# Patient Record
Sex: Female | Born: 1995 | Race: Black or African American | Hispanic: Yes | Marital: Single | Smoking: Never smoker
Health system: Southern US, Community
[De-identification: ages and names within clinical notes are randomized; demographics above are authoritative.]

## PROBLEM LIST (undated history)

## (undated) DIAGNOSIS — J45909 Unspecified asthma, uncomplicated: Secondary | ICD-10-CM

---

## 2019-09-30 ENCOUNTER — Encounter (HOSPITAL_COMMUNITY): Payer: Self-pay

## 2019-09-30 ENCOUNTER — Ambulatory Visit (HOSPITAL_COMMUNITY)
Admission: RE | Admit: 2019-09-30 | Discharge: 2019-09-30 | Disposition: A | Discharge status: Home / Self Care | Payer: BC Managed Care – PPO | Source: Ambulatory Visit | Attending: Family Medicine | Admitting: Family Medicine

## 2019-09-30 ENCOUNTER — Other Ambulatory Visit: Payer: Self-pay

## 2019-09-30 ENCOUNTER — Ambulatory Visit (HOSPITAL_COMMUNITY)
Admission: EM | Admit: 2019-09-30 | Discharge: 2019-09-30 | Disposition: A | Discharge status: Home / Self Care | Payer: BC Managed Care – PPO | Attending: Family Medicine | Admitting: Family Medicine

## 2019-09-30 DIAGNOSIS — S6010XA Contusion of unspecified finger with damage to nail, initial encounter: Secondary | ICD-10-CM | POA: Diagnosis not present

## 2019-09-30 DIAGNOSIS — S6702XA Crushing injury of left thumb, initial encounter: Secondary | ICD-10-CM | POA: Insufficient documentation

## 2019-09-30 DIAGNOSIS — Y9289 Other specified places as the place of occurrence of the external cause: Secondary | ICD-10-CM | POA: Diagnosis not present

## 2019-09-30 HISTORY — DX: Unspecified asthma, uncomplicated: J45.909

## 2019-09-30 NOTE — Discharge Instructions (Signed)
You may use over the counter ibuprofen or acetaminophen as needed.  ° °

## 2019-09-30 NOTE — ED Triage Notes (Signed)
Pt states she closed her thumb in the care door. Pt states this happened around 6:30 pm today. ( left thumb)

## 2019-10-01 NOTE — ED Provider Notes (Signed)
[facility] CARE CENTER   [id number] 09/30/19 Arrival Time: 1902  ASSESSMENT & PLAN:  1. Crush injury to thumb, left, initial encounter   2. Subungual hematoma of digit of hand, initial encounter     I have personally viewed the imaging studies ordered. No fracture observed.  With x-ray unavailable here, patient sent to outpatient radiology for imaging. Discussed specialist evaluation should a fracture be present; symptomatic management if not. Ibuprofen for discomfort. Simple wound care instructions discussed.  Procedure: Drainage of Subungual Hematoma LEFT thumb cleaned with chlorhexidine. Declines digital block. R/B/I/A discussed. Complications may include loss of the nail, re-accumulation of hematoma, and infection. Verbal consent to proceed obtained. Single nail trephination successfully performed via cautery pen with drainage of dark red blood. Patient reports relief of pressure. Site bandaged. To keep digit clean and dry for 48 hours.  Work note provided.   Reviewed expectations re: course of current medical issues. Questions answered. Outlined signs and symptoms indicating need for more acute intervention. Patient verbalized understanding. After Visit Summary given.  SUBJECTIVE: History from: patient. Faith Butler is a 23 y.o. female who reports persistent moderate pain of her left distal thumb; described as aching/throbbing; without radiation. Onset: abrupt. First noted: 1-2 hours ago. Injury/trama: reports shutting thumb in car door; immediate pain. Symptoms have progressed to a point and plateaued since beginning. Aggravating factors: certain movements. Alleviating factors: have not been identified. Associated symptoms: none reported. Extremity sensation changes or weakness: none. Self treatment: has not tried OTC therapies.  History of similar: no.  History reviewed. No pertinent surgical history.   ROS: As per HPI. All other systems negative.     OBJECTIVE:  Vitals:   09/30/19 1925 09/30/19 1928  BP:  (!) 143/79  Pulse:  86  Resp:  16  Temp:  98.6 F (37 C)  TempSrc:  Oral  SpO2:  99%  Weight: 79.4 kg     General appearance: alert; no distress HEENT: [place]; AT Neck: supple with FROM Resp: unlabored respirations Extremities: . L thumb: warm with well perfused appearance; well localized moderate tenderness over left distal thumb; without gross deformities; swelling: minimal; bruising: none; subungual hematoma present under proximal 1/3 of nail; normal thumb extension and flexion at DIP CV: brisk extremity capillary refill of LUE; 2+ radial pulse of LUE. Skin: warm and dry; no visible rashes Neurologic: gait normal; normal reflexes of LUE; normal sensation of LUE; normal strength of LUE Psychological: alert and cooperative; normal mood and affect  Imaging: DG Finger Thumb Left  Result Date: 09/30/2019 CLINICAL DATA:  Left thumb crush injury in a car door today. Initial encounter. EXAM: LEFT THUMB 2+V COMPARISON:  None. FINDINGS: There is no evidence of fracture or dislocation. There is no evidence of arthropathy or other focal bone abnormality. Soft tissues are unremarkable. IMPRESSION: Negative exam. Electronically Signed   By: Drusilla Kanner M.D.   On: 09/30/2019 20:29      No Known Allergies  Past Medical History:  Diagnosis Date  . Asthma    Social History   Socioeconomic History  . Marital status: Single    Spouse name: Not on file  . Number of children: Not on file  . Years of education: Not on file  . Highest education level: Not on file  Occupational History  . Not on file  Tobacco Use  . Smoking status: Never Smoker  . Smokeless tobacco: Never Used  Substance and Sexual Activity  . Alcohol use: Never  . Drug use: Never  .  Sexual activity: Yes  Other Topics Concern  . Not on file  Social History Narrative  . Not on file   Social Determinants of Health   Financial Resource Strain:   .  Difficulty of Paying Living Expenses: Not on file  Food Insecurity:   . Worried About Charity fundraiser in the Last Year: Not on file  . Ran Out of Food in the Last Year: Not on file  Transportation Needs:   . Lack of Transportation (Medical): Not on file  . Lack of Transportation (Non-Medical): Not on file  Physical Activity:   . Days of Exercise per Week: Not on file  . Minutes of Exercise per Session: Not on file  Stress:   . Feeling of Stress : Not on file  Social Connections:   . Frequency of Communication with Friends and Family: Not on file  . Frequency of Social Gatherings with Friends and Family: Not on file  . Attends Religious Services: Not on file  . Active Member of Clubs or Organizations: Not on file  . Attends Archivist Meetings: Not on file  . Marital Status: Not on file   Family History  Problem Relation Age of Onset  . Healthy Mother   . Healthy Father    History reviewed. No pertinent surgical history.    Vanessa Kick, MD 10/01/19 [phone]

## 2020-12-25 ENCOUNTER — Ambulatory Visit
Admission: EM | Admit: 2020-12-25 | Discharge: 2020-12-25 | Disposition: A | Discharge status: Home / Self Care | Payer: BC Managed Care – PPO | Attending: Family Medicine | Admitting: Family Medicine

## 2020-12-25 ENCOUNTER — Other Ambulatory Visit: Payer: Self-pay

## 2020-12-25 DIAGNOSIS — J4541 Moderate persistent asthma with (acute) exacerbation: Secondary | ICD-10-CM

## 2020-12-25 MED ORDER — ALBUTEROL SULFATE HFA 108 (90 BASE) MCG/ACT IN AERS: 1.0000 | INHALATION_SPRAY | Freq: Four times a day (QID) | RESPIRATORY_TRACT | 1 refills | Status: AC | PRN

## 2020-12-25 MED ORDER — PREDNISONE 20 MG PO TABS: 40.0000 mg | ORAL_TABLET | Freq: Every day | ORAL | 0 refills | Status: DC

## 2020-12-25 NOTE — ED Triage Notes (Signed)
Pt presents with c/o cough since Tuesday with some sob, per pt has h/o asthma

## 2020-12-27 NOTE — ED Provider Notes (Signed)
[facility] CARE CENTER   [id number] 12/25/20 Arrival Time: 1555  ASSESSMENT & PLAN:  1. Moderate persistent asthma with acute exacerbation    No indication for chest imaging at this time.  Begin: Meds ordered this encounter  Medications  . predniSONE (DELTASONE) 20 MG tablet    Sig: Take 2 tablets (40 mg total) by mouth daily.    Dispense:  10 tablet    Refill:  0  . albuterol (VENTOLIN HFA) 108 (90 Base) MCG/ACT inhaler    Sig: Inhale 1-2 puffs into the lungs every 6 (six) hours as needed for wheezing or shortness of breath.    Dispense:  1 each    Refill:  1    Asthma precautions given. OTC symptom care as needed.  Recommend:  Follow-up Information    [facility] .   Specialty: Urgent Care Why: If worsening or failing to improve as anticipated. Contact information: [address] [id number] mc [place] [address] [phone]              Reviewed expectations re: course of current medical issues. Questions answered. Outlined signs and symptoms indicating need for more acute intervention. Patient verbalized understanding. After Visit Summary given.  SUBJECTIVE: History from: patient.  Faith Butler is a 25 y.o. female who presents with complaint of fairly persistent wheezing and dry cough. Onset gradual, sev d ago. Triggers: change in weather. Describes wheezing as moderate when present. Fever: no. Overall normal PO intake without n/v. Sick contacts: no. Ambulatory without difficulty. No LE edema. Typically her asthma is well controlled. Inhaler use: none; out of inhaler; does not need regularly. OTC treatment: none.   Social History   Tobacco Use  Smoking Status Never Smoker  Smokeless Tobacco Never Used      OBJECTIVE:  Vitals:   12/25/20 1602  BP: 127/84  Pulse: (!) 109  Resp: 20  Temp: 98.4 F (36.9 C)  SpO2: 99%    Slight tachycardia noted.  General appearance: alert;  NAD HEENT: [place]; AT; without nasal congestion Neck: supple without LAD Cv: RRR without murmer Lungs: unlabored respirations, mild to moderate bilateral expiratory wheezing; cough: occasional dry; no significant respiratory distress Skin: warm and dry Psychological: alert and cooperative; normal mood and affect   No Known Allergies  Past Medical History:  Diagnosis Date  . Asthma    Family History  Problem Relation Age of Onset  . Healthy Mother   . Healthy Father    Social History   Socioeconomic History  . Marital status: Single    Spouse name: Not on file  . Number of children: Not on file  . Years of education: Not on file  . Highest education level: Not on file  Occupational History  . Not on file  Tobacco Use  . Smoking status: Never Smoker  . Smokeless tobacco: Never Used  Substance and Sexual Activity  . Alcohol use: Never  . Drug use: Never  . Sexual activity: Yes  Other Topics Concern  . Not on file  Social History Narrative  . Not on file   Social Determinants of Health   Financial Resource Strain: Not on file  Food Insecurity: Not on file  Transportation Needs: Not on file  Physical Activity: Not on file  Stress: Not on file  Social Connections: Not on file  Intimate Partner Violence: Not on file            Mardella Layman, MD 12/27/20 [phone]

## 2021-08-27 ENCOUNTER — Other Ambulatory Visit: Payer: Self-pay

## 2021-08-27 ENCOUNTER — Inpatient Hospital Stay (HOSPITAL_COMMUNITY): Payer: Medicaid Other

## 2021-08-27 ENCOUNTER — Inpatient Hospital Stay (HOSPITAL_COMMUNITY)
Admission: AD | Admit: 2021-08-27 | Discharge: 2021-08-27 | Disposition: A | Discharge status: Home / Self Care | Payer: Medicaid Other | Attending: Obstetrics and Gynecology | Admitting: Obstetrics and Gynecology

## 2021-08-27 ENCOUNTER — Encounter (HOSPITAL_COMMUNITY): Payer: Self-pay | Admitting: Obstetrics and Gynecology

## 2021-08-27 DIAGNOSIS — O4690 Antepartum hemorrhage, unspecified, unspecified trimester: Secondary | ICD-10-CM | POA: Diagnosis not present

## 2021-08-27 DIAGNOSIS — O3680X0 Pregnancy with inconclusive fetal viability, not applicable or unspecified: Secondary | ICD-10-CM | POA: Diagnosis not present

## 2021-08-27 DIAGNOSIS — Z3A01 Less than 8 weeks gestation of pregnancy: Secondary | ICD-10-CM | POA: Diagnosis not present

## 2021-08-27 DIAGNOSIS — O209 Hemorrhage in early pregnancy, unspecified: Secondary | ICD-10-CM | POA: Insufficient documentation

## 2021-08-27 LAB — COMPREHENSIVE METABOLIC PANEL
ALT: 15 U/L (ref 0–44)
AST: 18 U/L (ref 15–41)
Albumin: 3.6 g/dL (ref 3.5–5.0)
Alkaline Phosphatase: 46 U/L (ref 38–126)
Anion gap: 5 (ref 5–15)
BUN: <5 mg/dL — ABNORMAL LOW (ref 6–20)
CO2: 25 mmol/L (ref 22–32)
Calcium: 9.7 mg/dL (ref 8.9–10.3)
Chloride: 104 mmol/L (ref 98–111)
Creatinine, Ser: 0.9 mg/dL (ref 0.44–1.00)
GFR, Estimated: >60 mL/min (ref >60)
Glucose, Bld: 102 mg/dL — ABNORMAL HIGH (ref 70–99)
Potassium: 4 mmol/L (ref 3.5–5.1)
Sodium: 134 mmol/L — ABNORMAL LOW (ref 135–145)
Total Bilirubin: 0.6 mg/dL (ref 0.3–1.2)
Total Protein: 8 g/dL (ref 6.5–8.1)

## 2021-08-27 LAB — URINALYSIS, ROUTINE W REFLEX MICROSCOPIC
Bilirubin Urine: NEGATIVE
Glucose, UA: NEGATIVE mg/dL
Ketones, ur: 5 mg/dL — AB
Nitrite: NEGATIVE
Protein, ur: NEGATIVE mg/dL
Specific Gravity, Urine: 1.019 (ref 1.005–1.030)
pH: 5 (ref 5.0–8.0)

## 2021-08-27 LAB — CBC WITH DIFFERENTIAL/PLATELET
Abs Immature Granulocytes: 0.02 10*3/uL (ref 0.00–0.07)
Basophils Absolute: 0 10*3/uL (ref 0.0–0.1)
Basophils Relative: 0 %
Eosinophils Absolute: 0 10*3/uL (ref 0.0–0.5)
Eosinophils Relative: 0 %
HCT: 40.7 % (ref 36.0–46.0)
Hemoglobin: 13.3 g/dL (ref 12.0–15.0)
Immature Granulocytes: 0 %
Lymphocytes Relative: 29 %
Lymphs Abs: 2.5 10*3/uL (ref 0.7–4.0)
MCH: 27.9 pg (ref 26.0–34.0)
MCHC: 32.7 g/dL (ref 30.0–36.0)
MCV: 85.3 fL (ref 80.0–100.0)
Monocytes Absolute: 0.6 10*3/uL (ref 0.1–1.0)
Monocytes Relative: 7 %
Neutro Abs: 5.6 10*3/uL (ref 1.7–7.7)
Neutrophils Relative %: 64 %
Platelets: 454 10*3/uL — ABNORMAL HIGH (ref 150–400)
RBC: 4.77 MIL/uL (ref 3.87–5.11)
RDW: 14.6 % (ref 11.5–15.5)
WBC: 8.8 10*3/uL (ref 4.0–10.5)
nRBC: 0 % (ref 0.0–0.2)

## 2021-08-27 LAB — ABO/RH: ABO/RH(D): B POS

## 2021-08-27 LAB — POCT PREGNANCY, URINE: Preg Test, Ur: POSITIVE — AB

## 2021-08-27 LAB — HCG, QUANTITATIVE, PREGNANCY: hCG, Beta Chain, Quant, S: 18334 m[IU]/mL — ABNORMAL HIGH (ref <5)

## 2021-08-27 NOTE — MAU Note (Signed)
Pollie Corp is a 25 y.o. here in MAU reporting: had + UPT last week. Started spotting on Wednesday, last night bleeding started getting a little heavier. Not wearing a pad. No pain.  LMP: 07/07/21  Onset of complaint: ongoing  Pain score: 0/10  Vitals:   08/27/21 1547  BP: 119/67  Pulse: 99  Resp: 16  Temp: 98.3 F (36.8 C)  SpO2: 99%     Lab orders placed from triage: upt

## 2021-08-27 NOTE — MAU Provider Note (Addendum)
History   Faith Butler is a very nice 1/0 at [redacted]w[redacted]d by LMP with vaginal bleeding.   CSN: [id number]  Arrival date and time: 08/27/21 1509   Event Date/Time   First Provider Initiated Contact with Patient 08/27/21 1558      Chief Complaint  Patient presents with   Vaginal Bleeding   Vaginal Bleeding The patient's pertinent negatives include no pelvic pain or vaginal discharge. Pertinent negatives include no abdominal pain, chills, constipation, diarrhea, fever, nausea or vomiting. She started with spotting about one week ago and today she had heavy bleeding. She has had minimal cramping but notes she didn't feel like she was pregnant. She did not have any clots and did not feel like she passed any tissue.   OB History     Gravida  1   Para      Term      Preterm      AB      Living         SAB      IAB      Ectopic      Multiple      Live Births              Past Medical History:  Diagnosis Date   Asthma     No past surgical history on file.  Family History  Problem Relation Age of Onset   Healthy Mother    Healthy Father     Social History   Tobacco Use   Smoking status: Never   Smokeless tobacco: Never  Substance Use Topics   Alcohol use: Never   Drug use: Never    Allergies: No Known Allergies  Medications Prior to Admission  Medication Sig Dispense Refill Last Dose   albuterol (VENTOLIN HFA) 108 (90 Base) MCG/ACT inhaler Inhale 1-2 puffs into the lungs every 6 (six) hours as needed for wheezing or shortness of breath. 1 each 1    predniSONE (DELTASONE) 20 MG tablet Take 2 tablets (40 mg total) by mouth daily. 10 tablet 0     Review of Systems  Constitutional:  Negative for chills and fever.  Respiratory:  Negative for shortness of breath.   Cardiovascular:  Negative for chest pain.  Gastrointestinal:  Negative for abdominal pain, constipation, diarrhea, nausea and vomiting.  Genitourinary:  Positive for vaginal bleeding. Negative  for pelvic pain and vaginal discharge.  Physical Exam   Blood pressure 119/67, pulse 99, temperature 98.3 F (36.8 C), temperature source Oral, resp. rate 16, height 4\' 11"  (1.499 m), weight 85.6 kg, last menstrual period 07/07/2021, SpO2 99 %.  Physical Exam Constitutional:      Appearance: Normal appearance. She is normal weight.  HENT:     Head: Normocephalic and atraumatic.     Nose: Nose normal.  Eyes:     Extraocular Movements: Extraocular movements intact.     Conjunctiva/sclera: Conjunctivae normal.     Pupils: Pupils are equal, round, and reactive to light.  Cardiovascular:     Rate and Rhythm: Normal rate.  Pulmonary:     Effort: Pulmonary effort is normal.  Musculoskeletal:     Cervical back: Normal range of motion.  Skin:    General: Skin is warm and dry.  Neurological:     General: No focal deficit present.     Mental Status: She is alert and oriented to person, place, and time.  Psychiatric:        Mood and Affect: Mood normal.  Behavior: Behavior normal.        Thought Content: Thought content normal.        Judgment: Judgment normal.    MAU Course  Procedures  MDM HCG - 18334 CBC    Component Value Date/Time   WBC 8.8 08/27/2021 1606   RBC 4.77 08/27/2021 1606   HGB 13.3 08/27/2021 1606   HCT 40.7 08/27/2021 1606   PLT 454 (H) 08/27/2021 1606   MCV 85.3 08/27/2021 1606   MCH 27.9 08/27/2021 1606   MCHC 32.7 08/27/2021 1606   RDW 14.6 08/27/2021 1606   LYMPHSABS 2.5 08/27/2021 1606   MONOABS 0.6 08/27/2021 1606   EOSABS 0.0 08/27/2021 1606   BASOSABS 0.0 08/27/2021 1606   CMP Latest Ref Rng & Units 08/27/2021  Glucose 70 - 99 mg/dL 562(Z)  BUN 6 - 20 mg/dL <3(Y)  Creatinine 8.65 - 1.00 mg/dL 7.84  Sodium 696 - 295 mmol/L 134(L)  Potassium 3.5 - 5.1 mmol/L 4.0  Chloride 98 - 111 mmol/L 104  CO2 22 - 32 mmol/L 25  Calcium 8.9 - 10.3 mg/dL 9.7  Total Protein 6.5 - 8.1 g/dL 8.0  Total Bilirubin 0.3 - 1.2 mg/dL 0.6  Alkaline Phos 38 -  126 U/L 46  AST 15 - 41 U/L 18  ALT 0 - 44 U/L 15   Rh positive   US OB LESS THAN 14 WEEKS WITH OB TRANSVAGINAL  Result Date: 08/27/2021 CLINICAL DATA:  Pregnant patient with vaginal bleeding. Gestational age by LMP 7 weeks 2 days. Beta hCG is pending at this time. EXAM: OBSTETRIC <14 WK [place] AND TRANSVAGINAL OB US TECHNIQUE: Both transabdominal and transvaginal ultrasound examinations were performed for complete evaluation of the gestation as well as the maternal uterus, adnexal regions, and pelvic cul-de-sac. Transvaginal technique was performed to assess early pregnancy. COMPARISON:  None. FINDINGS: Intrauterine gestational sac: None Yolk sac:  Not Visualized. Embryo:  Not Visualized. Cardiac Activity: Not Visualized. Subchorionic hemorrhage:  Not applicable. Maternal uterus/adnexae: The uterus is anteverted. The endometrium is thickened at 14.6 mm. No intrauterine gestational sac or fluid in the endometrial canal. The left ovary is normal measuring 2.9 x 2.2 x 2.3 cm. Ovarian blood flow is seen. The right ovary measures 2.4 x 1.9 x 2.6 cm and contains a 1.8 cm cyst. Ovarian blood flow is seen. There is no adnexal mass. No pelvic free fluid. IMPRESSION: No intrauterine pregnancy or findings suspicious for ectopic pregnancy. Findings are consistent with pregnancy of unknown location and may reflect early intrauterine pregnancy not yet visualized sonographically, occult ectopic pregnancy, or failed pregnancy. Recommend trending of beta HCG as well as follow-up ultrasound in 7-10 days based on clinical course. Electronically Signed   By: Narda Rutherford M.D.   On: 08/27/2021 17:25        Assessment and Plan  Pregnancy of unknown anatomic location at [redacted]w[redacted]d by LMP - We discussed pregnancy of unknown location but that most likely consistent with a recent miscarriage based on her beta of 18K. I would expect IUP or EUP based on this beta but US shows no IUP/EUP. If SAB, expect beta to drop. Other  possibility is a molar pregnancy if beta rises. Unlikely to be viable pregnancy based on beta, GA by LMP and her bleeding.  - Scheduled for repeat beta on Tuesday. Discussed the importance of follow up. She lives in [place] area but comes to this area often. She would like to follow up with [place]. Appt made at [facility].  - Discussed  common causes of SAB. Reassured her this was nothing in her control.  - Discussed that she should wait for one normal cycle and then they may begin trying again if she feels emotionally ready as well - Rhogam not indicated.  - Answered all questions  Milas Hock 08/27/2021, 7:24 PM

## 2021-08-30 ENCOUNTER — Ambulatory Visit (INDEPENDENT_AMBULATORY_CARE_PROVIDER_SITE_OTHER): Payer: Medicaid Other

## 2021-08-30 ENCOUNTER — Ambulatory Visit: Payer: Self-pay

## 2021-08-30 ENCOUNTER — Other Ambulatory Visit: Payer: Self-pay

## 2021-08-30 DIAGNOSIS — O3680X0 Pregnancy with inconclusive fetal viability, not applicable or unspecified: Secondary | ICD-10-CM

## 2021-08-30 NOTE — Progress Notes (Signed)
Patient was assessed and managed by nursing staff during this encounter. I have reviewed the chart and agree with the documentation and plan. I have also made any necessary editorial changes.  Catalina Antigua, MD 08/30/2021 3:43 PM

## 2021-08-30 NOTE — Progress Notes (Signed)
Subject:  Patient presents for a follow up hcg quant. Last quant 25,003 on 11/12  Objective: Patient reports having some light spotting which is the same as her MAU visit. She denies having any pain at this time.   Assessment: Patient has no other concerns at this time.  Plan: Patient advised that she will be contacted with results and next steps. Advised to continue to monitor bleeding and pain and when to seek medical attention. Patient verbalized understanding.

## 2021-08-31 ENCOUNTER — Inpatient Hospital Stay (HOSPITAL_COMMUNITY): Payer: Medicaid Other | Admitting: Certified Registered"

## 2021-08-31 ENCOUNTER — Encounter (HOSPITAL_COMMUNITY): Payer: Self-pay | Admitting: Obstetrics & Gynecology

## 2021-08-31 ENCOUNTER — Telehealth: Payer: Self-pay | Admitting: Advanced Practice Midwife

## 2021-08-31 ENCOUNTER — Inpatient Hospital Stay (HOSPITAL_COMMUNITY): Payer: Medicaid Other

## 2021-08-31 ENCOUNTER — Encounter (HOSPITAL_COMMUNITY)
Admission: AD | Disposition: A | Discharge status: Home / Self Care | Payer: Self-pay | Source: Home / Self Care | Attending: Obstetrics & Gynecology

## 2021-08-31 ENCOUNTER — Inpatient Hospital Stay (HOSPITAL_COMMUNITY)
Admission: AD | Admit: 2021-08-31 | Discharge: 2021-08-31 | Disposition: A | Discharge status: Home / Self Care | Payer: Medicaid Other | Attending: Obstetrics & Gynecology | Admitting: Obstetrics & Gynecology

## 2021-08-31 DIAGNOSIS — O039 Complete or unspecified spontaneous abortion without complication: Secondary | ICD-10-CM | POA: Diagnosis present

## 2021-08-31 DIAGNOSIS — O99511 Diseases of the respiratory system complicating pregnancy, first trimester: Secondary | ICD-10-CM | POA: Insufficient documentation

## 2021-08-31 DIAGNOSIS — J45909 Unspecified asthma, uncomplicated: Secondary | ICD-10-CM | POA: Insufficient documentation

## 2021-08-31 DIAGNOSIS — O00101 Right tubal pregnancy without intrauterine pregnancy: Secondary | ICD-10-CM | POA: Diagnosis not present

## 2021-08-31 DIAGNOSIS — E669 Obesity, unspecified: Secondary | ICD-10-CM | POA: Insufficient documentation

## 2021-08-31 DIAGNOSIS — O99211 Obesity complicating pregnancy, first trimester: Secondary | ICD-10-CM | POA: Insufficient documentation

## 2021-08-31 DIAGNOSIS — O00111 Right tubal pregnancy with intrauterine pregnancy: Secondary | ICD-10-CM

## 2021-08-31 DIAGNOSIS — O3680X0 Pregnancy with inconclusive fetal viability, not applicable or unspecified: Secondary | ICD-10-CM

## 2021-08-31 HISTORY — PX: DIAGNOSTIC LAPAROSCOPY WITH REMOVAL OF ECTOPIC PREGNANCY: SHX6449

## 2021-08-31 LAB — BETA HCG QUANT (REF LAB): hCG Quant: 18475 m[IU]/mL

## 2021-08-31 LAB — TYPE AND SCREEN
ABO/RH(D): B POS
Antibody Screen: NEGATIVE

## 2021-08-31 SURGERY — LAPAROSCOPY, WITH ECTOPIC PREGNANCY SURGICAL TREATMENT
Anesthesia: General | Laterality: Right

## 2021-08-31 MED ORDER — DEXMEDETOMIDINE (PRECEDEX) IN NS 20 MCG/5ML (4 MCG/ML) IV SYRINGE
PREFILLED_SYRINGE | INTRAVENOUS | Status: AC
  Filled 2021-08-31: qty 10

## 2021-08-31 MED ORDER — ORAL CARE MOUTH RINSE: 15.0000 mL | Freq: Once | OROMUCOSAL | Status: AC

## 2021-08-31 MED ORDER — OXYCODONE HCL 5 MG PO TABS
ORAL_TABLET | ORAL | Status: AC
  Administered 2021-08-31: 5 mg via ORAL
  Filled 2021-08-31: qty 1

## 2021-08-31 MED ORDER — CHLORHEXIDINE GLUCONATE 0.12 % MT SOLN
15.0000 mL | Freq: Once | OROMUCOSAL | Status: AC
  Administered 2021-08-31: 15 mL via OROMUCOSAL
  Filled 2021-08-31: qty 15

## 2021-08-31 MED ORDER — FENTANYL CITRATE (PF) 250 MCG/5ML IJ SOLN
INTRAMUSCULAR | Status: AC
  Filled 2021-08-31: qty 5

## 2021-08-31 MED ORDER — OXYCODONE-ACETAMINOPHEN 5-325 MG PO TABS: 1.0000 | ORAL_TABLET | ORAL | 0 refills | Status: DC | PRN

## 2021-08-31 MED ORDER — 0.9 % SODIUM CHLORIDE (POUR BTL) OPTIME
TOPICAL | Status: DC | PRN
  Administered 2021-08-31: 1000 mL

## 2021-08-31 MED ORDER — OXYCODONE HCL 5 MG PO TABS: 5.0000 mg | ORAL_TABLET | Freq: Once | ORAL | Status: AC

## 2021-08-31 MED ORDER — BUPIVACAINE HCL (PF) 0.25 % IJ SOLN
INTRAMUSCULAR | Status: DC | PRN
  Administered 2021-08-31: 7 mL

## 2021-08-31 MED ORDER — DEXAMETHASONE SODIUM PHOSPHATE 10 MG/ML IJ SOLN
INTRAMUSCULAR | Status: DC | PRN
  Administered 2021-08-31: 10 mg via INTRAVENOUS

## 2021-08-31 MED ORDER — OXYCODONE-ACETAMINOPHEN 5-325 MG PO TABS: 1.0000 | ORAL_TABLET | ORAL | 0 refills | Status: AC | PRN

## 2021-08-31 MED ORDER — IBUPROFEN 600 MG PO TABS: 600.0000 mg | ORAL_TABLET | Freq: Four times a day (QID) | ORAL | 3 refills | Status: AC | PRN

## 2021-08-31 MED ORDER — SUCCINYLCHOLINE CHLORIDE 200 MG/10ML IV SOSY
PREFILLED_SYRINGE | INTRAVENOUS | Status: DC | PRN
Start: 2021-08-31 — End: 2021-08-31
  Administered 2021-08-31: 100 mg via INTRAVENOUS

## 2021-08-31 MED ORDER — LIDOCAINE 2% (20 MG/ML) 5 ML SYRINGE
INTRAMUSCULAR | Status: DC | PRN
  Administered 2021-08-31: 80 mg via INTRAVENOUS

## 2021-08-31 MED ORDER — SCOPOLAMINE 1 MG/3DAYS TD PT72
MEDICATED_PATCH | TRANSDERMAL | Status: AC
  Administered 2021-08-31: 1.5 mg via TRANSDERMAL
  Filled 2021-08-31: qty 1

## 2021-08-31 MED ORDER — FENTANYL CITRATE (PF) 100 MCG/2ML IJ SOLN: 25.0000 ug | INTRAMUSCULAR | Status: DC | PRN

## 2021-08-31 MED ORDER — LACTATED RINGERS IV SOLN: INTRAVENOUS | Status: DC

## 2021-08-31 MED ORDER — ONDANSETRON HCL 4 MG/2ML IJ SOLN
INTRAMUSCULAR | Status: DC | PRN
  Administered 2021-08-31: 4 mg via INTRAVENOUS

## 2021-08-31 MED ORDER — MIDAZOLAM HCL 2 MG/2ML IJ SOLN
INTRAMUSCULAR | Status: AC
  Filled 2021-08-31: qty 2

## 2021-08-31 MED ORDER — ACETAMINOPHEN 500 MG PO TABS
ORAL_TABLET | ORAL | Status: AC
  Administered 2021-08-31: 1000 mg via ORAL
  Filled 2021-08-31: qty 2

## 2021-08-31 MED ORDER — PHENYLEPHRINE 40 MCG/ML (10ML) SYRINGE FOR IV PUSH (FOR BLOOD PRESSURE SUPPORT)
PREFILLED_SYRINGE | INTRAVENOUS | Status: DC | PRN
  Administered 2021-08-31: 200 ug via INTRAVENOUS

## 2021-08-31 MED ORDER — PROPOFOL 10 MG/ML IV BOLUS
INTRAVENOUS | Status: DC | PRN
  Administered 2021-08-31: 170 mg via INTRAVENOUS

## 2021-08-31 MED ORDER — BUPIVACAINE HCL (PF) 0.25 % IJ SOLN
INTRAMUSCULAR | Status: AC
  Filled 2021-08-31: qty 30

## 2021-08-31 MED ORDER — ACETAMINOPHEN 500 MG PO TABS: 1000.0000 mg | ORAL_TABLET | Freq: Once | ORAL | Status: AC

## 2021-08-31 MED ORDER — SCOPOLAMINE 1 MG/3DAYS TD PT72: 1.0000 | MEDICATED_PATCH | Freq: Once | TRANSDERMAL | Status: DC

## 2021-08-31 MED ORDER — FENTANYL CITRATE (PF) 250 MCG/5ML IJ SOLN
INTRAMUSCULAR | Status: DC | PRN
  Administered 2021-08-31 (×3): 50 ug via INTRAVENOUS
  Administered 2021-08-31: 100 ug via INTRAVENOUS

## 2021-08-31 MED ORDER — PROMETHAZINE HCL 25 MG/ML IJ SOLN
6.2500 mg | INTRAMUSCULAR | Status: DC | PRN
Start: 2021-08-31 — End: 2021-08-31

## 2021-08-31 MED ORDER — PROPOFOL 10 MG/ML IV BOLUS
INTRAVENOUS | Status: AC
  Filled 2021-08-31: qty 20

## 2021-08-31 MED ORDER — ROCURONIUM BROMIDE 10 MG/ML (PF) SYRINGE
PREFILLED_SYRINGE | INTRAVENOUS | Status: DC | PRN
  Administered 2021-08-31: 50 mg via INTRAVENOUS

## 2021-08-31 MED ORDER — SODIUM CHLORIDE 0.9 % IR SOLN
Status: DC | PRN
  Administered 2021-08-31: 3000 mL

## 2021-08-31 MED ORDER — SUGAMMADEX SODIUM 200 MG/2ML IV SOLN
INTRAVENOUS | Status: DC | PRN
  Administered 2021-08-31: 200 mg via INTRAVENOUS

## 2021-08-31 SURGICAL SUPPLY — 31 items
CABLE HIGH FREQUENCY MONO STRZ (ELECTRODE) IMPLANT
DERMABOND ADVANCED (GAUZE/BANDAGES/DRESSINGS) ×1
DERMABOND ADVANCED .7 DNX12 (GAUZE/BANDAGES/DRESSINGS) ×1 IMPLANT
DURAPREP 26ML APPLICATOR (WOUND CARE) ×2 IMPLANT
GLOVE SURG ENC MOIS LTX SZ6.5 (GLOVE) ×2 IMPLANT
GLOVE SURG UNDER POLY LF SZ7 (GLOVE) ×8 IMPLANT
GOWN STRL REUS W/ TWL LRG LVL3 (GOWN DISPOSABLE) ×2 IMPLANT
GOWN STRL REUS W/TWL LRG LVL3 (GOWN DISPOSABLE) ×2
IRRIG SUCT STRYKERFLOW 2 WTIP (MISCELLANEOUS) ×2
IRRIGATION STRYKERFLOW (MISCELLANEOUS) IMPLANT
IRRIGATION SUCT STRKRFLW 2 WTP (MISCELLANEOUS) ×1 IMPLANT
IRRIGATOR STRYKERFLOW (MISCELLANEOUS)
KIT TURNOVER KIT B (KITS) ×2 IMPLANT
NEEDLE INSUFFLATION 14GA 120MM (NEEDLE) ×2 IMPLANT
NS IRRIG 1000ML POUR BTL (IV SOLUTION) ×2 IMPLANT
PACK LAPAROSCOPY BASIN (CUSTOM PROCEDURE TRAY) ×2 IMPLANT
PACK TRENDGUARD 450 HYBRID PRO (MISCELLANEOUS) ×1 IMPLANT
PROTECTOR NERVE ULNAR (MISCELLANEOUS) ×4 IMPLANT
SET TUBE SMOKE EVAC HIGH FLOW (TUBING) ×2 IMPLANT
SHEARS HARMONIC ACE PLUS 36CM (ENDOMECHANICALS) IMPLANT
SLEEVE ENDOPATH XCEL 5M (ENDOMECHANICALS) ×2 IMPLANT
STRIP CLOSURE SKIN 1/2X4 (GAUZE/BANDAGES/DRESSINGS) IMPLANT
SUT VICRYL 0 UR6 27IN ABS (SUTURE) ×4 IMPLANT
SUT VICRYL 4-0 PS2 18IN ABS (SUTURE) ×2 IMPLANT
TOWEL GREEN STERILE FF (TOWEL DISPOSABLE) ×4 IMPLANT
TRAY FOLEY MTR SLVR 16FR STAT (SET/KITS/TRAYS/PACK) ×2 IMPLANT
TRENDGUARD 450 HYBRID PRO PACK (MISCELLANEOUS) ×2
TROCAR XCEL DIL TIP R 11M (ENDOMECHANICALS) ×2 IMPLANT
TROCAR XCEL NON-BLD 11X100MML (ENDOMECHANICALS) ×2 IMPLANT
TROCAR XCEL NON-BLD 5MMX100MML (ENDOMECHANICALS) ×2 IMPLANT
WARMER LAPAROSCOPE (MISCELLANEOUS) ×2 IMPLANT

## 2021-08-31 NOTE — Anesthesia Postprocedure Evaluation (Signed)
Anesthesia Post Note  Patient: Faith Butler  Procedure(s) Performed: DIAGNOSTIC LAPAROSCOPY WITH REMOVAL OF ECTOPIC PREGNANCY (Right)     Patient location during evaluation: PACU Anesthesia Type: General Level of consciousness: awake and alert Pain management: pain level controlled Vital Signs Assessment: post-procedure vital signs reviewed and stable Respiratory status: spontaneous breathing, nonlabored ventilation and respiratory function stable Cardiovascular status: blood pressure returned to baseline and stable Postop Assessment: no apparent nausea or vomiting Anesthetic complications: no   No notable events documented.  Last Vitals:  Vitals:   08/31/21 1720 08/31/21 1820  BP: (!) 94/55 106/64  Pulse: 70 78  Resp: 20 19  Temp:  36.8 C  SpO2: 91% 100%    Last Pain:  Vitals:   08/31/21 1820  TempSrc:   PainSc: 4                  Cecile Hearing

## 2021-08-31 NOTE — Transfer of Care (Signed)
Immediate Anesthesia Transfer of Care Note  Patient: Faith Butler  Procedure(s) Performed: DIAGNOSTIC LAPAROSCOPY WITH REMOVAL OF ECTOPIC PREGNANCY (Right)  Patient Location: PACU  Anesthesia Type:General  Level of Consciousness: drowsy and patient cooperative  Airway & Oxygen Therapy: Patient Spontanous Breathing and Patient connected to face mask oxygen  Post-op Assessment: Report given to RN and Post -op Vital signs reviewed and stable  Post vital signs: Reviewed and stable  Last Vitals:  Vitals Value Taken Time  BP 98/60 08/31/21 1638  Temp    Pulse 90 08/31/21 1638  Resp 19 08/31/21 1639  SpO2 89 % 08/31/21 1638  Vitals shown include unvalidated device data.  Last Pain:  Vitals:   08/31/21 1501  TempSrc: Axillary  PainSc: 0-No pain         Complications: No notable events documented.

## 2021-08-31 NOTE — H&P (Signed)
Faith Butler is an 25 y.o. female. [id number] Patient's last menstrual period was 07/07/2021. She has been followed with inappropriate HCG  rise and no IUP on [place]. F/U US today showed ectopic pregnancy with FHM, no sign of rupture/   Pertinent Gynecological History: OB History: [id number]   Menstrual History:  Patient's last menstrual period was 07/07/2021.    Past Medical History:  Diagnosis Date   Asthma     History reviewed. No pertinent surgical history.  Family History  Problem Relation Age of Onset   Healthy Mother    Healthy Father     Social History:  reports that she has never smoked. She has never used smokeless tobacco. She reports that she does not drink alcohol and does not use drugs.  Allergies: No Known Allergies  Medications Prior to Admission  Medication Sig Dispense Refill Last Dose   albuterol (VENTOLIN HFA) 108 (90 Base) MCG/ACT inhaler Inhale 1-2 puffs into the lungs every 6 (six) hours as needed for wheezing or shortness of breath. 1 each 1 Past Month    Review of Systems  Constitutional: Negative.   Cardiovascular: Negative.   Gastrointestinal: Negative.   Genitourinary: Negative.    Temperature (P) 98.9 F (37.2 C), last menstrual period 07/07/2021. Physical Exam Vitals and nursing note reviewed.  Constitutional:      Appearance: She is obese. She is not ill-appearing.  Eyes:     Pupils: Pupils are equal, round, and reactive to light.  Cardiovascular:     Rate and Rhythm: Normal rate.  Pulmonary:     Effort: Pulmonary effort is normal.  Abdominal:     General: Abdomen is flat.  Skin:    General: Skin is warm and dry.  Neurological:     Mental Status: She is alert.  Psychiatric:        Mood and Affect: Mood normal.        Behavior: Behavior normal.    No results found for this or any previous visit (from the past 24 hour(s)).  US OB Transvaginal  Addendum Date: 08/31/2021   ADDENDUM REPORT: 08/31/2021 13:07 ADDENDUM: Critical  Value/emergent results were discussed by telephone on 08/31/2021 at 1:07 pm to provider Vonzella Nipple , who verbally acknowledged these results. Electronically Signed   By: Duanne Guess D.O.   On: 08/31/2021 13:07   Result Date: 08/31/2021 CLINICAL DATA:  Pregnancy of unknown location EXAM: TRANSVAGINAL OB ULTRASOUND TECHNIQUE: Transvaginal ultrasound was performed for complete evaluation of the gestation as well as the maternal uterus, adnexal regions, and pelvic cul-de-sac. COMPARISON:  08/27/2021 FINDINGS: Intrauterine gestational sac: None Extra uterine or right adnexal gestational sac Yolk sac:  Visualized. Embryo:  Visualized. Cardiac Activity: Visualized. Heart Rate: 142 bpm CRL:   6.7 mm   6 w 3 d Subchorionic hemorrhage:  None visualized. Maternal uterus/adnexae: No intrauterine gestational sac. Adjacent to the right ovary is a cystic mass containing live embryo with active heart tones. The adjacent right ovary measures 3.9 x 1.9 x 2.5 cm and appears unremarkable. The left ovary measures 1.8 x 1.6 x 1.5 cm and appears unremarkable. No free fluid within the pelvis. IMPRESSION: 1. Live ectopic pregnancy in the right adnexum measuring 6 week 3 days by crown-rump length with active heart tones at 142 BPM. 2. No evidence of an intrauterine pregnancy. 3. No free fluid within the pelvis. Ordering provider has been paged. Documentation of the communication of the above findings will be added to the report in the form of an addendum.  Electronically Signed: By: Duanne Guess D.O. On: 08/31/2021 13:03    Assessment/Plan: Right tubal ectopic 6.3 week CRL and FHM on [place], needs surgical management. Patient desires surgical management with laparoscopy and removal of ectopic pregnancy possible salpingectomy.  The risks of surgery were discussed in detail with the patient including but not limited to: bleeding which may require transfusion or reoperation; infection which may require prolonged hospitalization or  re-hospitalization and antibiotic therapy; injury to bowel, bladder, ureters and major vessels or other surrounding organs; formation of adhesions; need for additional procedures including laparotomy or subsequent procedures secondary to abnormal pathology; thromboembolic phenomenon; incisional problems and other postoperative or anesthesia complications.  Patient was told that the likelihood that her condition and symptoms will be treated effectively with this surgical management was very high; the postoperative expectations were also discussed in detail. The patient also understands the alternative treatment options which were discussed in full. All questions were answered.    Scheryl Darter 08/31/2021, 1:55 PM

## 2021-08-31 NOTE — Anesthesia Procedure Notes (Signed)
Procedure Name: Intubation Date/Time: 08/31/2021 3:21 PM Performed by: Rosiland Oz, CRNA Pre-anesthesia Checklist: Patient identified, Emergency Drugs available, Suction available, Patient being monitored and Timeout performed Patient Re-evaluated:Patient Re-evaluated prior to induction Oxygen Delivery Method: Circle system utilized Preoxygenation: Pre-oxygenation with 100% oxygen Induction Type: IV induction and Rapid sequence Laryngoscope Size: Miller and 3 Grade View: Grade I Tube type: Oral Tube size: 7.5 mm Number of attempts: 1 Airway Equipment and Method: Stylet Placement Confirmation: ETT inserted through vocal cords under direct vision, positive ETCO2 and breath sounds checked- equal and bilateral Secured at: 21 cm Dental Injury: Teeth and Oropharynx as per pre-operative assessment

## 2021-08-31 NOTE — Anesthesia Preprocedure Evaluation (Signed)
Anesthesia Evaluation  Patient identified by MRN, date of birth, ID band Patient awake    Reviewed: Allergy & Precautions, NPO status , Patient's Chart, lab work & pertinent test results  Airway Mallampati: I  TM Distance: >3 FB Neck ROM: Full    Dental  (+) Teeth Intact, Dental Advisory Given   Pulmonary asthma ,    Pulmonary exam normal breath sounds clear to auscultation       Cardiovascular negative cardio ROS Normal cardiovascular exam Rhythm:Regular Rate:Normal     Neuro/Psych negative neurological ROS  negative psych ROS   GI/Hepatic negative GI ROS, Neg liver ROS,   Endo/Other  Obesity   Renal/GU negative Renal ROS     Musculoskeletal negative musculoskeletal ROS (+)   Abdominal   Peds  Hematology negative hematology ROS (+)   Anesthesia Other Findings Day of surgery medications reviewed with the patient.  Reproductive/Obstetrics                             Anesthesia Physical Anesthesia Plan  ASA: 2 and emergent  Anesthesia Plan: General   Post-op Pain Management:    Induction: Intravenous, Cricoid pressure planned and Rapid sequence  PONV Risk Score and Plan: 4 or greater and Scopolamine patch - Pre-op, Midazolam, Dexamethasone and Ondansetron  Airway Management Planned: Oral ETT  Additional Equipment:   Intra-op Plan:   Post-operative Plan: Extubation in OR  Informed Consent: I have reviewed the patients History and Physical, chart, labs and discussed the procedure including the risks, benefits and alternatives for the proposed anesthesia with the patient or authorized representative who has indicated his/her understanding and acceptance.     Dental advisory given  Plan Discussed with: CRNA  Anesthesia Plan Comments:         Anesthesia Quick Evaluation

## 2021-08-31 NOTE — MAU Provider Note (Signed)
History     CSN: [id number]  Arrival date and time: 08/31/21 1109   Event Date/Time   First Provider Initiated Contact with Patient 08/31/21 1320      Chief Complaint  Patient presents with   Vaginal Bleeding   HPI [name] is a 25 y.o. G1P0 at [redacted]w[redacted]d who presents to MAU today at the request of the office. Patient was seen at [facility] yesterday for STAT hCG follow-up from MAU visit on 08/27/21. Beta hCG was unchanged from 48 hours prior. Patient denies pain or fever. She has continued to have light spotting since last MAU visit.   OB History     Gravida  3   Para  1   Term  1   Preterm      AB  1   Living  1      SAB      IAB  1   Ectopic      Multiple      Live Births  1           Past Medical History:  Diagnosis Date   Asthma     History reviewed. No pertinent surgical history.  Family History  Problem Relation Age of Onset   Healthy Mother    Healthy Father     Social History   Tobacco Use   Smoking status: Never   Smokeless tobacco: Never  Substance Use Topics   Alcohol use: Never   Drug use: Never    Allergies: No Known Allergies  Medications Prior to Admission  Medication Sig Dispense Refill Last Dose   albuterol (VENTOLIN HFA) 108 (90 Base) MCG/ACT inhaler Inhale 1-2 puffs into the lungs every 6 (six) hours as needed for wheezing or shortness of breath. 1 each 1 Past Month    Review of Systems  Constitutional:  Negative for fever.  Gastrointestinal:  Negative for abdominal pain.  Genitourinary:  Positive for vaginal bleeding.  Physical Exam   Temperature (P) 98.9 F (37.2 C), last menstrual period 07/07/2021.  Physical Exam Vitals and nursing note reviewed.  Constitutional:      General: She is not in acute distress.    Appearance: She is obese.  HENT:     Head: Normocephalic.  Cardiovascular:     Rate and Rhythm: Normal rate.  Pulmonary:     Effort: Pulmonary effort is normal.  Abdominal:     General:  There is no distension.     Palpations: Abdomen is soft. There is no mass.     Tenderness: There is no abdominal tenderness.  Neurological:     Mental Status: She is alert and oriented to person, place, and time.  Psychiatric:        Mood and Affect: Mood normal.     US OB Transvaginal  Addendum Date: 08/31/2021   ADDENDUM REPORT: 08/31/2021 13:07 ADDENDUM: Critical Value/emergent results were discussed by telephone on 08/31/2021 at 1:07 pm to provider Vonzella Nipple , who verbally acknowledged these results. Electronically Signed   By: Duanne Guess D.O.   On: 08/31/2021 13:07   Result Date: 08/31/2021 CLINICAL DATA:  Pregnancy of unknown location EXAM: TRANSVAGINAL OB ULTRASOUND TECHNIQUE: Transvaginal ultrasound was performed for complete evaluation of the gestation as well as the maternal uterus, adnexal regions, and pelvic cul-de-sac. COMPARISON:  08/27/2021 FINDINGS: Intrauterine gestational sac: None Extra uterine or right adnexal gestational sac Yolk sac:  Visualized. Embryo:  Visualized. Cardiac Activity: Visualized. Heart Rate: 142 bpm CRL:   6.7 mm  6 w 3 d Subchorionic hemorrhage:  None visualized. Maternal uterus/adnexae: No intrauterine gestational sac. Adjacent to the right ovary is a cystic mass containing live embryo with active heart tones. The adjacent right ovary measures 3.9 x 1.9 x 2.5 cm and appears unremarkable. The left ovary measures 1.8 x 1.6 x 1.5 cm and appears unremarkable. No free fluid within the pelvis. IMPRESSION: 1. Live ectopic pregnancy in the right adnexum measuring 6 week 3 days by crown-rump length with active heart tones at 142 BPM. 2. No evidence of an intrauterine pregnancy. 3. No free fluid within the pelvis. Ordering provider has been paged. Documentation of the communication of the above findings will be added to the report in the form of an addendum. Electronically Signed: By: Duanne Guess D.O. On: 08/31/2021 13:03    MAU Course   Procedures None  MDM Repeat US today due to lack in change in quant hCG  Dr. Linden Dolin called from radiology with [place] report as noted above Discussed patient with Dr. Debroah Loop. He will come to MAU to discuss surgery with the patient.  Patient states NPO since last night   Assessment and Plan  A: Live ectopic pregnancy at [redacted]w[redacted]d  P:  Patient to OR with Dr. Onnie Boer, PA-C 08/31/2021, 1:56 PM

## 2021-08-31 NOTE — Op Note (Signed)
Faith Butler PROCEDURE DATE: 08/31/2021  PREOPERATIVE DIAGNOSIS: Right ectopic pregnancy with fetal heart motion POSTOPERATIVE DIAGNOSIS: right fallopian tube ectopic pregnancy PROCEDURE: Laparoscopic right partial salpingectomy and removal of ectopic pregnancy SURGEON:  Adam Phenix, MD ANESTHESIOLOGIST: Cecile Hearing, MD Anesthesiologist: Cecile Hearing, MD CRNA: Rosiland Oz, CRNA  INDICATIONS: 25 y.o. [phone] at [redacted]w[redacted]d here with the preoperative diagnoses as listed above.  Please refer to preoperative notes for more details. Patient was counseled regarding need for laparoscopic salpingectomy. Risks of surgery including bleeding which may require transfusion or reoperation, infection, injury to bowel or other surrounding organs, need for additional procedures including laparotomy and other postoperative/anesthesia complications were explained to patient.  Written informed consent was obtained.  FINDINGS:  No hemoperitoneum .  Dilated proximal right fallopian tube containing ectopic gestation. Small normal appearing uterus, normal left fallopian tube, right ovary and left ovary.  ANESTHESIA: General INTRAVENOUS FLUIDS: 1000 ml ESTIMATED BLOOD LOSS: 20 ml URINE OUTPUT: 100 ml SPECIMENS: Right fallopian tube segment containing ectopic gestation COMPLICATIONS: None immediate  PROCEDURE IN DETAIL:  The patient was taken to the operating room where general anesthesia was administered and was found to be adequate.  She was placed in the dorsal lithotomy position, and was prepped and draped in a sterile manner.  A Foley catheter was inserted into her bladder and attached to constant drainage and a uterine manipulator was then advanced into the uterus .    After an adequate timeout was performed, attention was turned to the abdomen where an umbilical incision was made with the scalpel.  The Optiview 11-mm trocar and sleeve were then advanced without difficulty with the  laparoscope under direct visualization into the abdomen.  The abdomen was then insufflated with carbon dioxide gas and adequate pneumoperitoneum was obtained.  A survey of the patient's pelvis and abdomen revealed the findings above.  Two 5-mm left lower quadrant ports were then placed under direct visualization.  Attention was then turned to the right fallopian tube which was grasped and ligated from the underlying mesosalpinx and uterine attachment using the Harmonic instrument. The proximal segment containing the ectopic pregnancy was excised.  Good hemostasis was noted.  The specimen was placed in an EndoCatch bag and removed from the abdomen intact.  The abdomen was desufflated, and all instruments were removed.  The fascial incision of the 10-mm site was reapproximated with a 0 Vicryl figure-of-eight stitch; and all skin incisions were closed with 4-0 Vicryl and Dermabond. The patient tolerated the procedure well.  All instruments, needles, and sponge counts were correct x 2. The patient was taken to the recovery room in stable condition.   The patient will be discharged to home as per PACU criteria.  Routine postoperative instructions given.  She was prescribed Percocet.  She will follow up in the clinic in about 2-3 weeks for postoperative evaluation.   Adam Phenix, MD 08/31/2021 4:33 PM

## 2021-08-31 NOTE — MAU Note (Signed)
Pt was seen last week for vag bleeding. Unable to see pregnancy on U/S . Had f/u BHCG in office yesterday and was told BHCG went up. Still c/o some spotting today . Denies any pain or cramping. Pt just worried because not sure where pregnancy is,

## 2021-08-31 NOTE — Telephone Encounter (Signed)
VLTCB. Patient stated she intends to drive from [place] to [place] for evaluation of possible ectopic pregnancy. CNM restated recommendation that patient be evaluated in closest ED rather than driving the distance to present to MAU specifically.  Clayton Bibles, MSA, MSN, CNM Certified Nurse Midwife, Biochemist, clinical for [employer], [facility]

## 2021-09-01 ENCOUNTER — Encounter (HOSPITAL_COMMUNITY): Payer: Self-pay | Admitting: Obstetrics & Gynecology

## 2021-09-01 LAB — SURGICAL PATHOLOGY

## 2022-10-23 IMAGING — US US OB < 14 WEEKS - US OB TV
1 series · 15 of 17 positions shown · non-contrast
Comparison: None.

CLINICAL DATA: Pregnant patient with vaginal bleeding. Gestational
age by LMP 7 weeks 2 days. Beta hCG is pending at this time.

EXAM:
OBSTETRIC <14 WK US AND TRANSVAGINAL OB US
TECHNIQUE: Both transabdominal and transvaginal ultrasound examinations were
performed for complete evaluation of the gestation as well as the
maternal uterus, adnexal regions, and pelvic cul-de-sac.
Transvaginal technique was performed to assess early pregnancy.

[Series 1: us ob < 14 weeks - us ob tv · 15 of 17 slices shown]
[im 1/17]
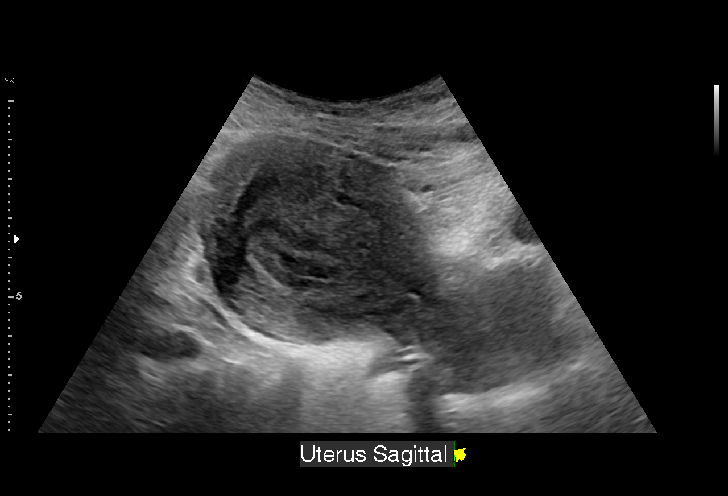
[im 2/17]
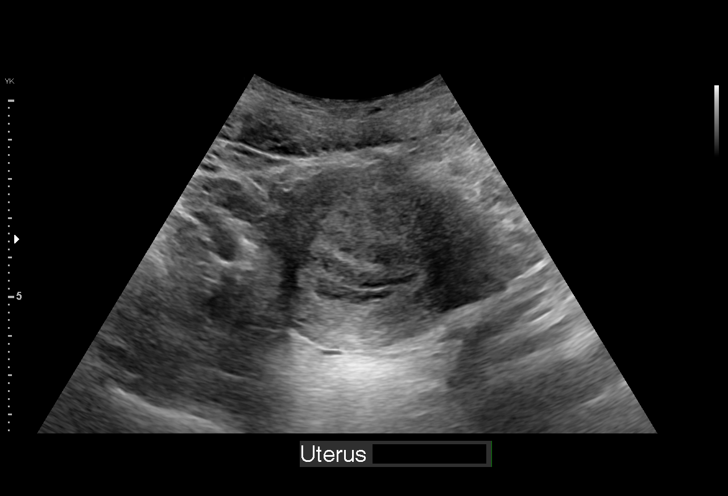
[im 3/17]
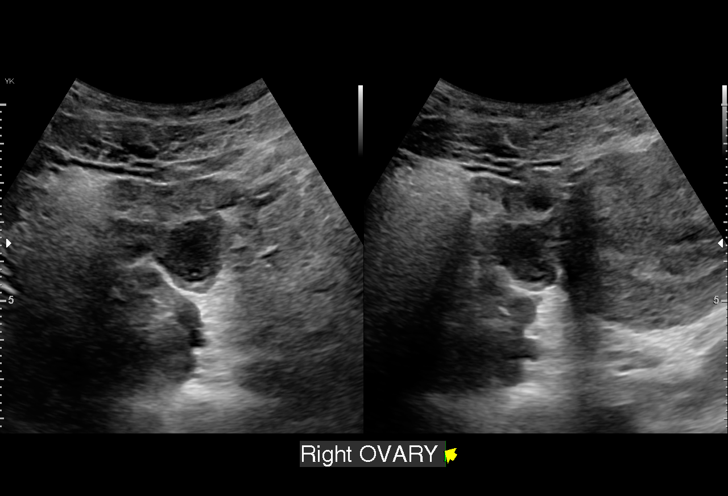
[im 4/17]
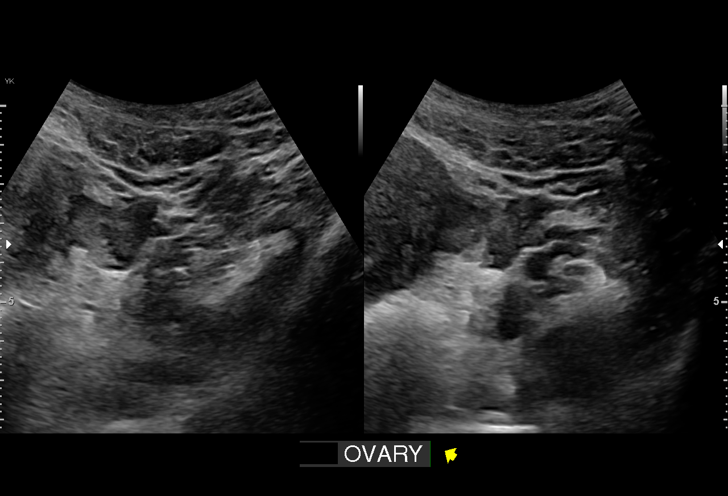
[im 6/17]
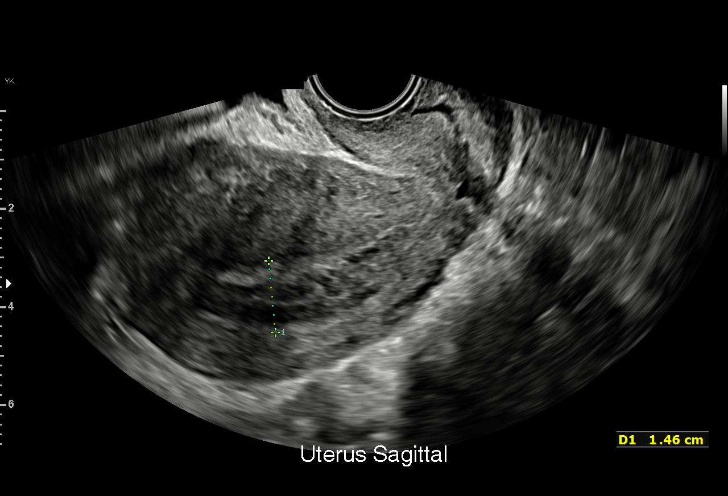
[im 7/17]
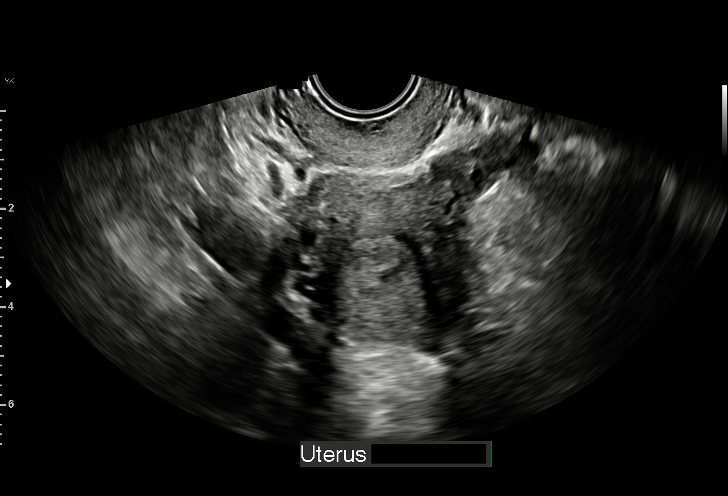
[im 8/17]
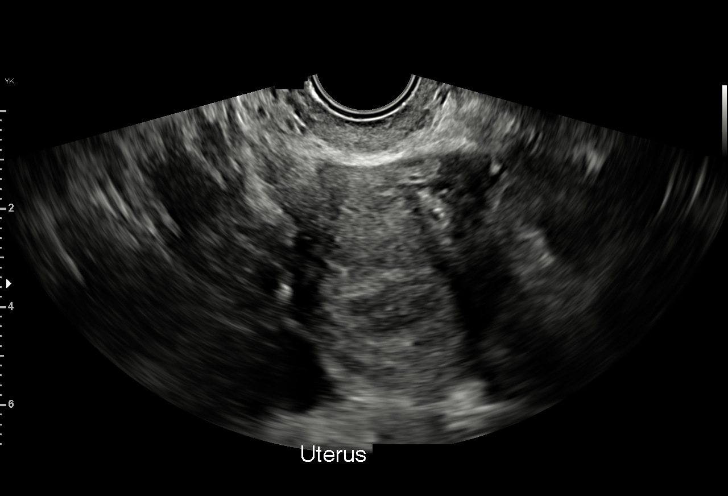
[im 9/17]
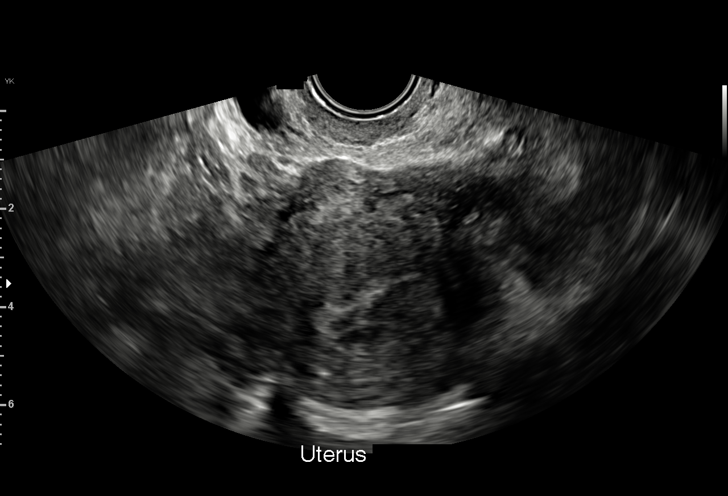
[im 10/17]
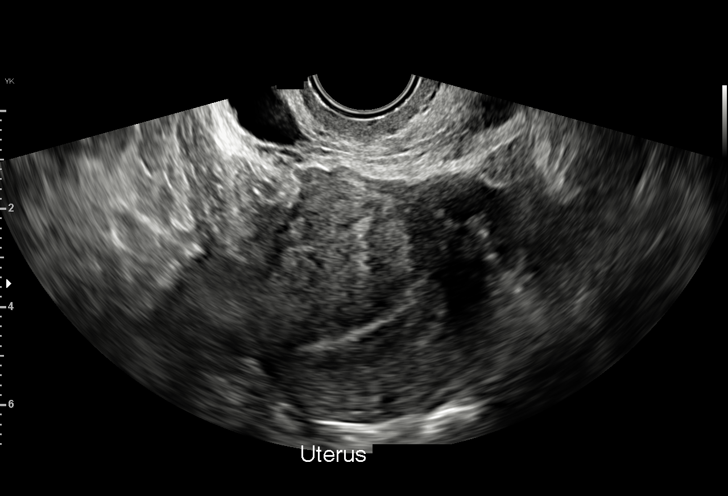
[im 11/17]
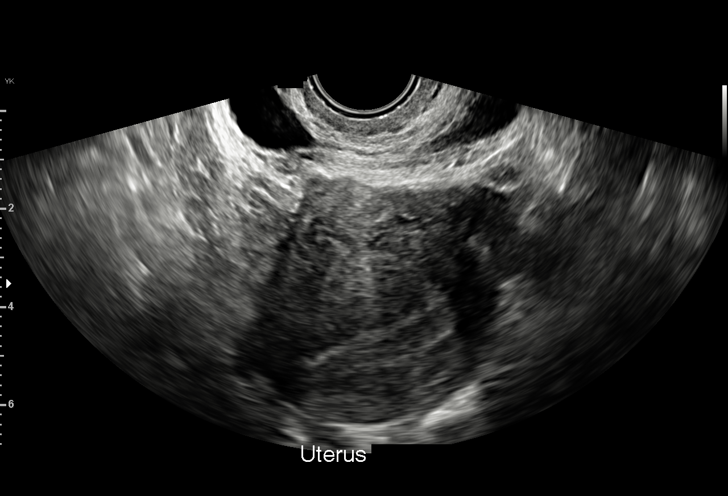
[im 12/17]
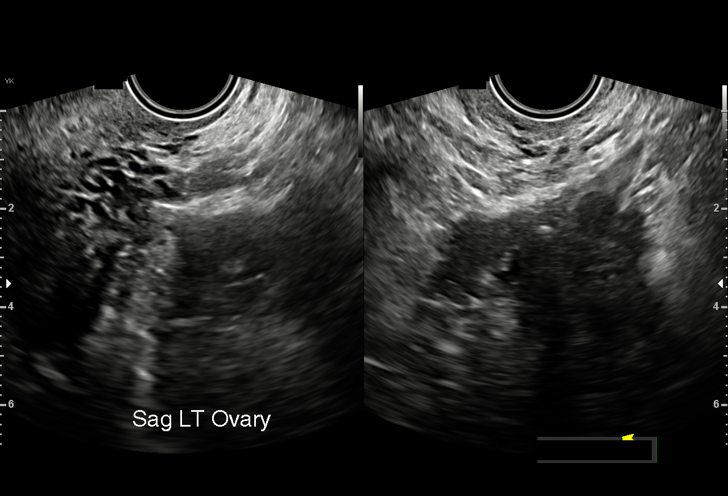
[im 14/17]
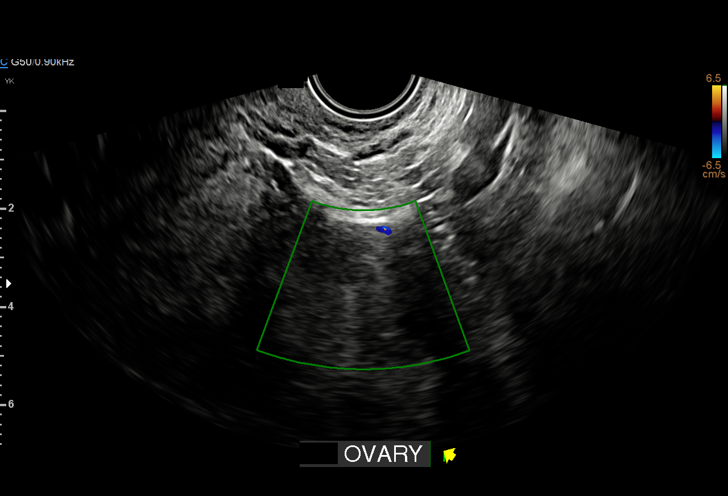
[im 15/17]
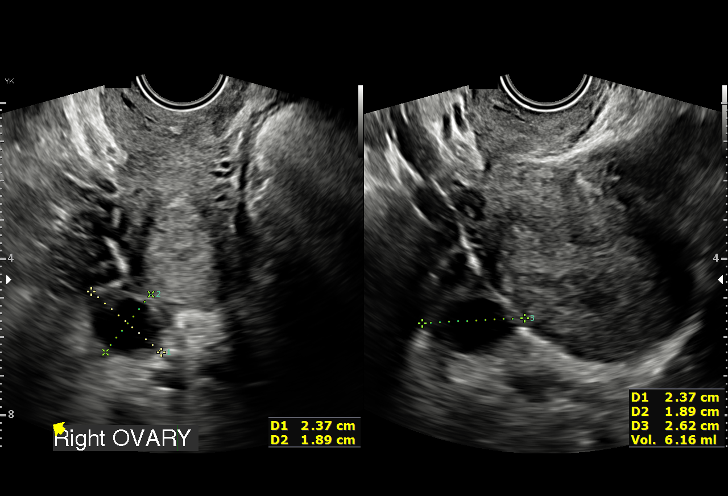
[im 16/17]
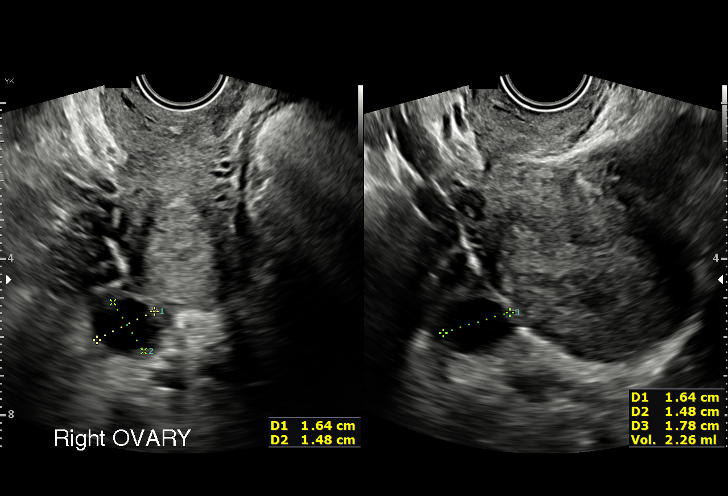
[im 17/17]
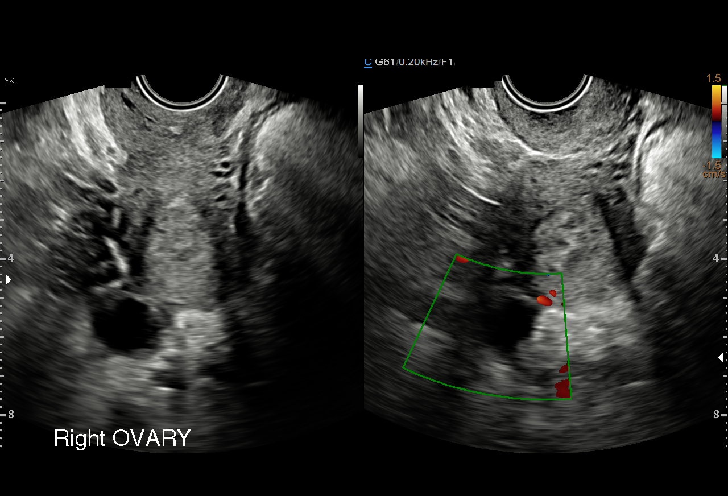

[15 of 17 positions shown; findings below may reference images not displayed]

FINDINGS: Intrauterine gestational sac: None

Yolk sac:  Not Visualized.

Embryo:  Not Visualized.

Cardiac Activity: Not Visualized.

Subchorionic hemorrhage:  Not applicable.

Maternal uterus/adnexae: The uterus is anteverted. The endometrium
is thickened at 14.6 mm. No intrauterine gestational sac or fluid in
the endometrial canal. The left ovary is normal measuring 2.9 x
x 2.3 cm. Ovarian blood flow is seen. The right ovary measures 2.4 x
1.9 x 2.6 cm and contains a 1.8 cm cyst. Ovarian blood flow is seen.
There is no adnexal mass. No pelvic free fluid.
IMPRESSION: No intrauterine pregnancy or findings suspicious for ectopic
pregnancy. Findings are consistent with pregnancy of unknown
location and may reflect early intrauterine pregnancy not yet
visualized sonographically, occult ectopic pregnancy, or failed
pregnancy. Recommend trending of beta HCG as well as follow-up
ultrasound in 7-10 days based on clinical course.

## 2022-10-27 IMAGING — US US OB TRANSVAGINAL
1 series · 14 of 28 positions shown · non-contrast
Comparison: 08/27/2021
COMPARISON: 08/27/2021

Addendum:
CLINICAL DATA: Pregnancy of unknown location

EXAM:
TRANSVAGINAL OB ULTRASOUND
TECHNIQUE: Transvaginal ultrasound was performed for complete evaluation of the
gestation as well as the maternal uterus, adnexal regions, and
pelvic cul-de-sac.

[Series 1: us ob transvaginal · 40 acquisitions, 14 frames shown]
[im 2/40]
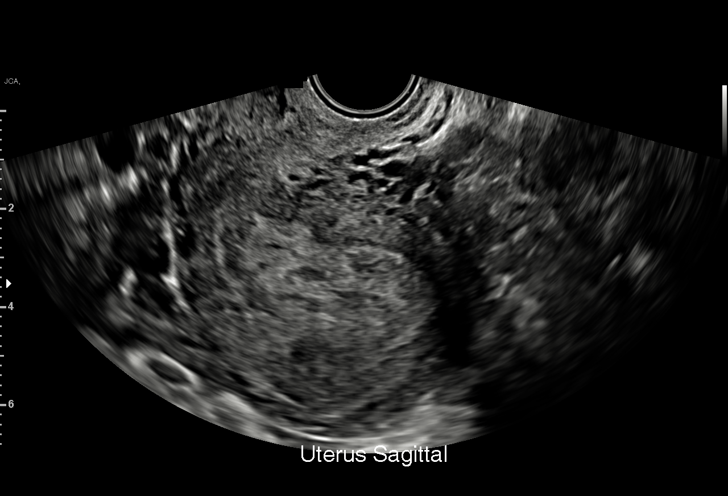
[im 5/40]
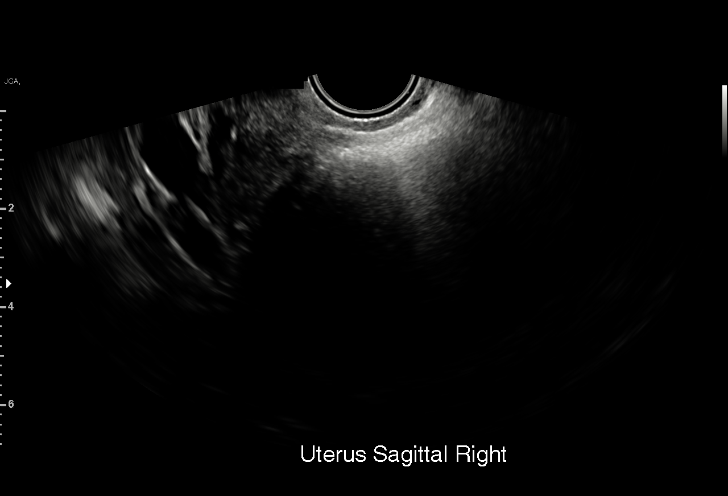
[im 8/40]
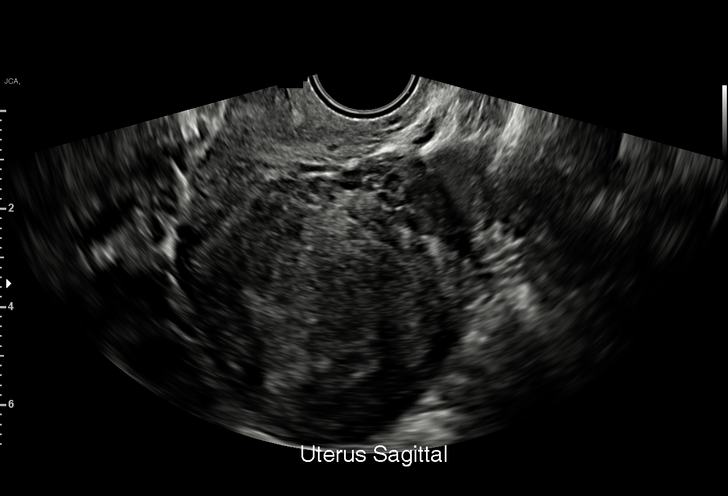
[im 11/40]
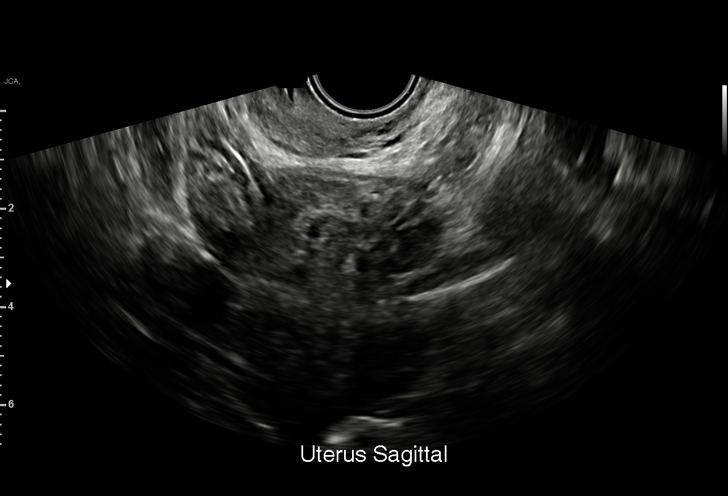
[im 14/40]
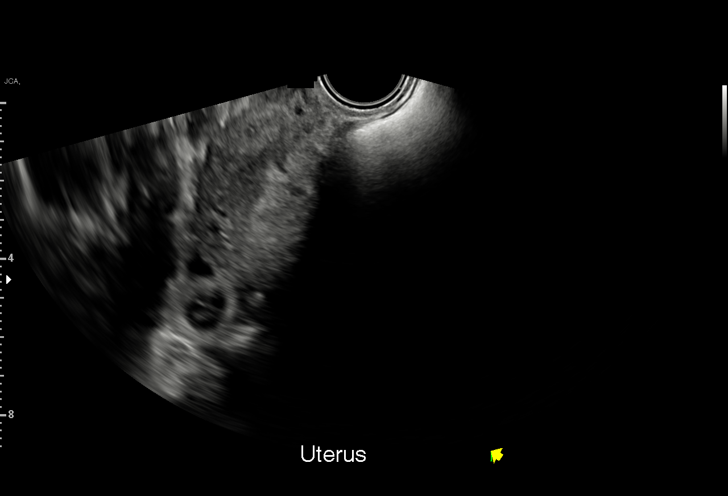
[im 16/40]
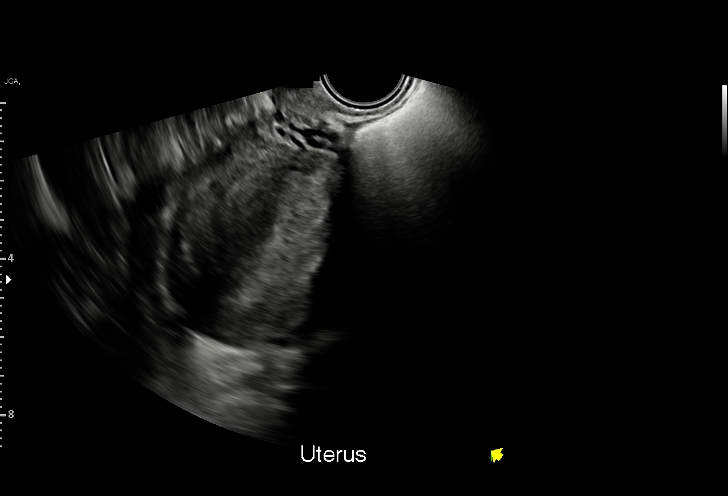
[im 19/40]
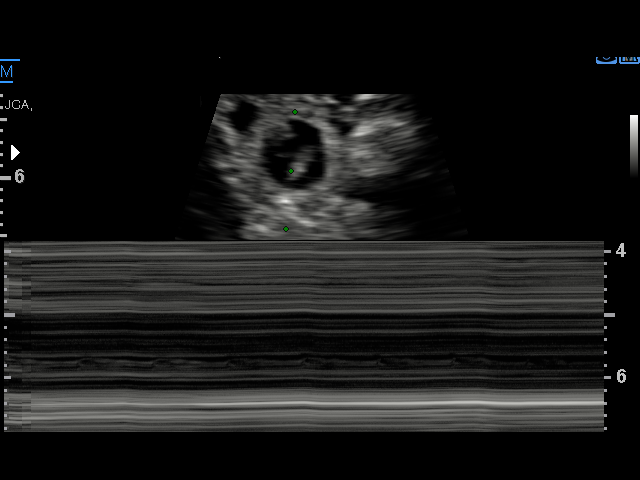
[im 22/40]
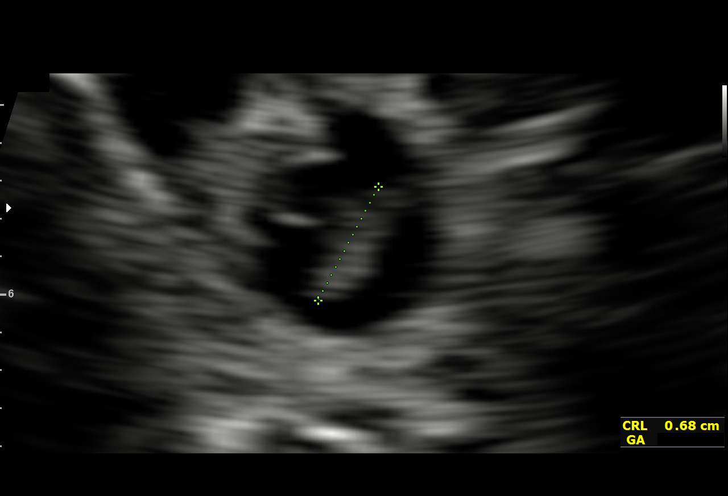
[im 25/40]
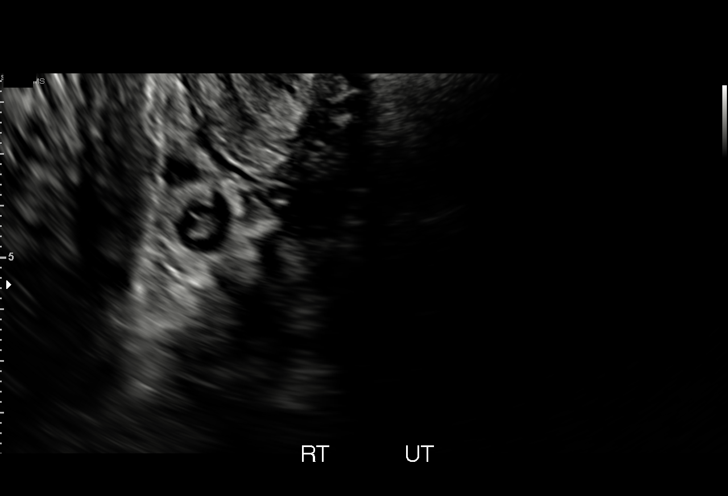
[im 28/40]
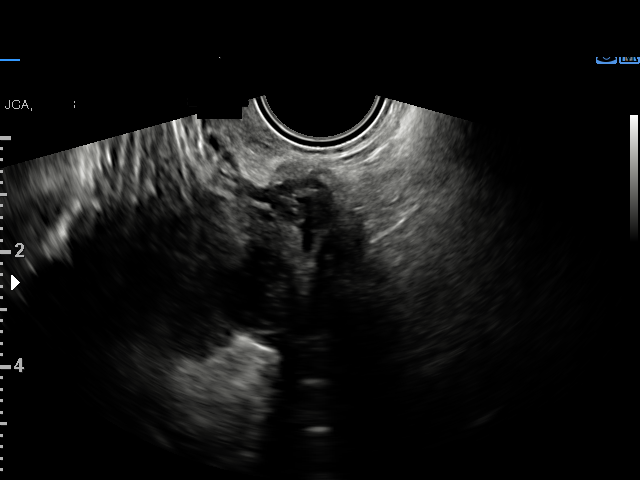
[im 31/40]
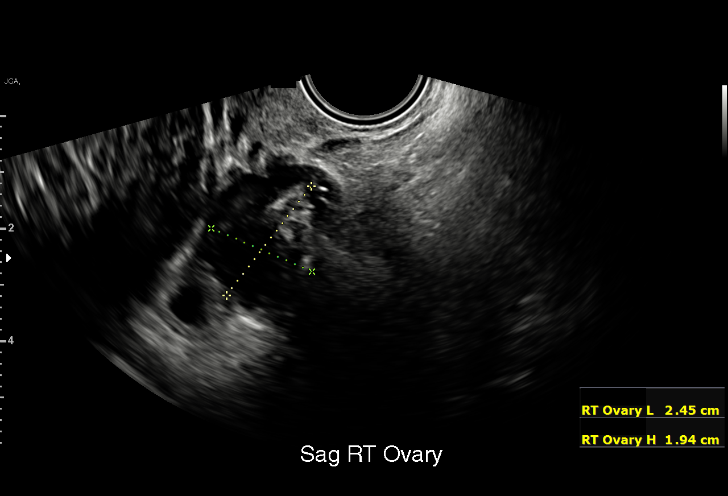
[im 34/40]
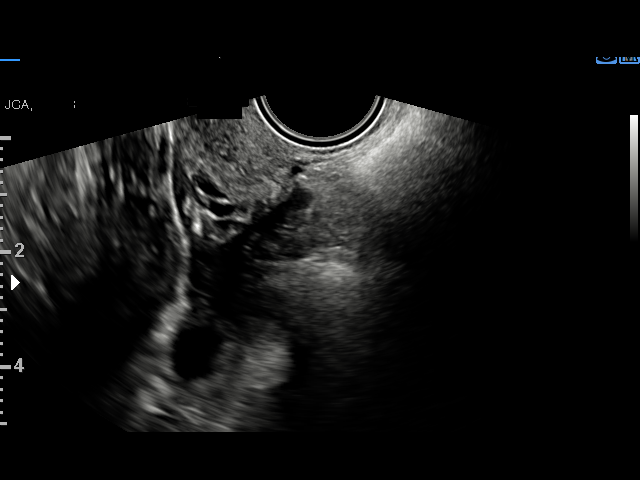
[im 37/40]
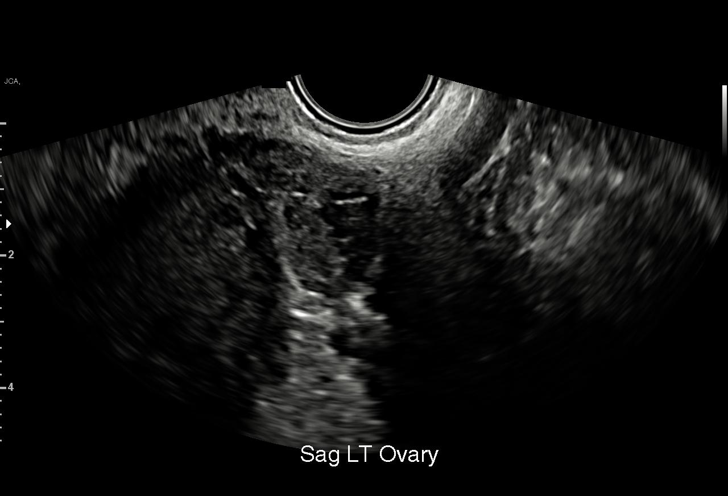
[im 40/40]
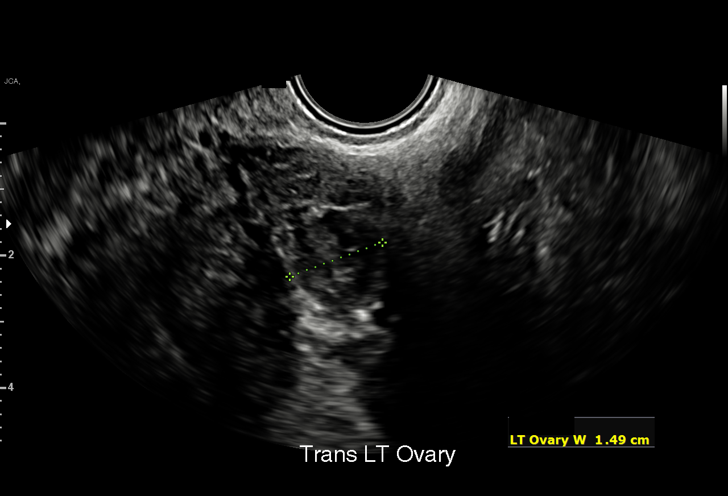

[14 of 28 positions shown; findings below may reference images not displayed]

FINDINGS: Intrauterine gestational sac: None

Extra uterine or right adnexal gestational sac

Yolk sac:  Visualized.

Embryo:  Visualized.

Cardiac Activity: Visualized.

Heart Rate: 142 bpm

CRL:   6.7 mm   6 w 3 d

Subchorionic hemorrhage:  None visualized.

Maternal uterus/adnexae: No intrauterine gestational sac. Adjacent
to the right ovary is a cystic mass containing live embryo with
active heart tones. The adjacent right ovary measures 3.9 x 1.9 x
2.5 cm and appears unremarkable. The left ovary measures 1.8 x 1.6 x
1.5 cm and appears unremarkable. No free fluid within the pelvis.
IMPRESSION: 1. Live ectopic pregnancy in the right adnexum measuring 6 week 3
days by crown-rump length with active heart tones at 142 BPM.
2. No evidence of an intrauterine pregnancy.
3. No free fluid within the pelvis.

Ordering provider has been paged. Documentation of the communication
of the above findings will be added to the report in the form of an
addendum.

ADDENDUM:
Critical Value/emergent results were discussed by telephone on
08/31/2021 at [DATE] to provider CAFE-VELO DORINGER , who verbally
acknowledged these results.

*** End of Addendum ***
FINDINGS: Intrauterine gestational sac: None

Extra uterine or right adnexal gestational sac

Yolk sac:  Visualized.

Embryo:  Visualized.

Cardiac Activity: Visualized.

Heart Rate: 142 bpm

CRL:   6.7 mm   6 w 3 d

Subchorionic hemorrhage:  None visualized.

Maternal uterus/adnexae: No intrauterine gestational sac. Adjacent
to the right ovary is a cystic mass containing live embryo with
active heart tones. The adjacent right ovary measures 3.9 x 1.9 x
2.5 cm and appears unremarkable. The left ovary measures 1.8 x 1.6 x
1.5 cm and appears unremarkable. No free fluid within the pelvis.
IMPRESSION: 1. Live ectopic pregnancy in the right adnexum measuring 6 week 3
days by crown-rump length with active heart tones at 142 BPM.
2. No evidence of an intrauterine pregnancy.
3. No free fluid within the pelvis.

Ordering provider has been paged. Documentation of the communication
of the above findings will be added to the report in the form of an
addendum.
